# Patient Record
Sex: Female | Born: 1995 | Race: Black or African American | Hispanic: No | Marital: Single | Smoking: Never smoker
Health system: Southern US, Community
[De-identification: ages and names within clinical notes are randomized; demographics above are authoritative.]

## PROBLEM LIST (undated history)

## (undated) DIAGNOSIS — J45909 Unspecified asthma, uncomplicated: Secondary | ICD-10-CM

## (undated) DIAGNOSIS — T7840XA Allergy, unspecified, initial encounter: Secondary | ICD-10-CM

## (undated) HISTORY — DX: Allergy, unspecified, initial encounter: T78.40XA

## (undated) HISTORY — DX: Unspecified asthma, uncomplicated: J45.909

---
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
## 2013-07-27 ENCOUNTER — Ambulatory Visit (INDEPENDENT_AMBULATORY_CARE_PROVIDER_SITE_OTHER): Payer: BC Managed Care – PPO | Admitting: Physician Assistant

## 2013-07-27 VITALS — BP 122/70 | HR 87 | Temp 98.3°F | Resp 18 | Ht 63.0 in | Wt 163.0 lb

## 2013-07-27 DIAGNOSIS — R05 Cough: Secondary | ICD-10-CM

## 2013-07-27 DIAGNOSIS — J309 Allergic rhinitis, unspecified: Secondary | ICD-10-CM

## 2013-07-27 DIAGNOSIS — J0110 Acute frontal sinusitis, unspecified: Secondary | ICD-10-CM

## 2013-07-27 DIAGNOSIS — R059 Cough, unspecified: Secondary | ICD-10-CM

## 2013-07-27 MED ORDER — BENZONATATE 100 MG PO CAPS: 100.0000 mg | ORAL_CAPSULE | Freq: Three times a day (TID) | ORAL | Status: DC | PRN

## 2013-07-27 MED ORDER — FLUTICASONE PROPIONATE 50 MCG/ACT NA SUSP: 2.0000 | Freq: Every day | NASAL | Status: AC

## 2013-07-27 MED ORDER — AMOXICILLIN-POT CLAVULANATE 875-125 MG PO TABS: 1.0000 | ORAL_TABLET | Freq: Two times a day (BID) | ORAL | Status: DC

## 2013-07-27 MED ORDER — HYDROCODONE-HOMATROPINE 5-1.5 MG/5ML PO SYRP: 5.0000 mL | ORAL_SOLUTION | Freq: Three times a day (TID) | ORAL | Status: DC | PRN

## 2013-07-27 NOTE — Patient Instructions (Signed)
The antibiotic prescribed today is for your present infection only. It is very important to follow the directions for the medication prescribed. Antibiotics are generally given for a specified period of time (7-10 days, for example) to be taken at specific intervals (every 4, 6, 8 or 12 hours). This is necessary to keep the right amount of the medication in the bloodstream. Too much of the medication may cause an adverse reaction, too little may not be completely effective.  To clear your infection completely, continue taking the antibiotic for the full time of treatment, even if you begin to feel better after a few days.  If you miss a dose of the antibiotic, take it as soon as possible. Then go back to your regular dosing schedule. However, don't double up doses.    Begin taking the Augmentin as directed.  Be sure to finish the full course. Take with food and water  Continue taking Zyrtec daily  Use the Flonase (2 sprays each nostril) every day - this will work best with consistent daily use  Take benzonatate (Tessalon Perles) every 8 hours as needed for cough  Use Hycodan syrup if needed for cough at bed time - may make you sleepy  Plenty of fluids (water is best!) and rest  Please let us know if any symptoms are worsening or not improving   Sinusitis Sinusitis is redness, soreness, and swelling (inflammation) of the paranasal sinuses. Paranasal sinuses are air pockets within the bones of your face (beneath the eyes, the middle of the forehead, or above the eyes). In healthy paranasal sinuses, mucus is able to drain out, and air is able to circulate through them by way of your nose. However, when your paranasal sinuses are inflamed, mucus and air can become trapped. This can allow bacteria and other germs to grow and cause infection. Sinusitis can develop quickly and last only a short time (acute) or continue over a long period (chronic). Sinusitis that lasts for more than 12 weeks is  considered chronic.  CAUSES  Causes of sinusitis include:  Allergies.  Structural abnormalities, such as displacement of the cartilage that separates your nostrils (deviated septum), which can decrease the air flow through your nose and sinuses and affect sinus drainage.  Functional abnormalities, such as when the small hairs (cilia) that line your sinuses and help remove mucus do not work properly or are not present. SYMPTOMS  Symptoms of acute and chronic sinusitis are the same. The primary symptoms are pain and pressure around the affected sinuses. Other symptoms include:  Upper toothache.  Earache.  Headache.  Bad breath.  Decreased sense of smell and taste.  A cough, which worsens when you are lying flat.  Fatigue.  Fever.  Thick drainage from your nose, which often is green and may contain pus (purulent).  Swelling and warmth over the affected sinuses. DIAGNOSIS  Your caregiver will perform a physical exam. During the exam, your caregiver may:  Look in your nose for signs of abnormal growths in your nostrils (nasal polyps).  Tap over the affected sinus to check for signs of infection.  View the inside of your sinuses (endoscopy) with a special imaging device with a light attached (endoscope), which is inserted into your sinuses. If your caregiver suspects that you have chronic sinusitis, one or more of the following tests may be recommended:  Allergy tests.  Nasal culture A sample of mucus is taken from your nose and sent to a lab and screened for bacteria.  Nasal cytology A sample of mucus is taken from your nose and examined by your caregiver to determine if your sinusitis is related to an allergy. TREATMENT  Most cases of acute sinusitis are related to a viral infection and will resolve on their own within 10 days. Sometimes medicines are prescribed to help relieve symptoms (pain medicine, decongestants, nasal steroid sprays, or saline sprays).  However, for  sinusitis related to a bacterial infection, your caregiver will prescribe antibiotic medicines. These are medicines that will help kill the bacteria causing the infection.  Rarely, sinusitis is caused by a fungal infection. In theses cases, your caregiver will prescribe antifungal medicine. For some cases of chronic sinusitis, surgery is needed. Generally, these are cases in which sinusitis recurs more than 3 times per year, despite other treatments. HOME CARE INSTRUCTIONS   Drink plenty of water. Water helps thin the mucus so your sinuses can drain more easily.  Use a humidifier.  Inhale steam 3 to 4 times a day (for example, sit in the bathroom with the shower running).  Apply a warm, moist washcloth to your face 3 to 4 times a day, or as directed by your caregiver.  Use saline nasal sprays to help moisten and clean your sinuses.  Take over-the-counter or prescription medicines for pain, discomfort, or fever only as directed by your caregiver. SEEK IMMEDIATE MEDICAL CARE IF:  You have increasing pain or severe headaches.  You have nausea, vomiting, or drowsiness.  You have swelling around your face.  You have vision problems.  You have a stiff neck.  You have difficulty breathing. MAKE SURE YOU:   Understand these instructions.  Will watch your condition.  Will get help right away if you are not doing well or get worse. Document Released: 02/22/2005 Document Revised: 05/17/2011 Document Reviewed: 03/09/2011 [facility] Patient Information 2014 [place], [place].

## 2013-07-27 NOTE — Progress Notes (Signed)
   Subjective:    Patient ID: Unice Bailey, female    DOB: 1995/07/09, 18 y.o.   MRN: [id number]  HPI   Ms. Wiberg is a very pleasant 18 yr old female here with concern for illness.  Reports sore throat x 2 weeks - constant.  Now with "really bad cough" this week.  "I can feel it in my chest."  Cough is productive.  No SOB but occ wheeze when coughing.  Pt reports she "outgrew" asthma.  She does have allergies - takes 2 zyrtec daily, uses Flonase prn but admits the flonase is expired.  She does have some headache/pressure.  No fever.  Has taken Aleve D, Dayquil, Nyquil, cough syrup.  Cough is very bothersome at night - keeping everyone awake.  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  HENT: Positive for congestion, rhinorrhea, sinus pressure and sore throat. Negative for ear pain.   Respiratory: Positive for cough and wheezing. Negative for shortness of breath.   Cardiovascular: Negative.   Gastrointestinal: Negative.   Musculoskeletal: Negative.   Skin: Negative.        Objective:   Physical Exam  Vitals reviewed. Constitutional: She is oriented to person, place, and time. She appears well-developed and well-nourished. No distress.  HENT:  Head: Normocephalic and atraumatic.  Right Ear: Tympanic membrane and ear canal normal.  Left Ear: Tympanic membrane and ear canal normal.  Nose: Mucosal edema and rhinorrhea present. Right sinus exhibits frontal sinus tenderness. Right sinus exhibits no maxillary sinus tenderness. Left sinus exhibits no maxillary sinus tenderness and no frontal sinus tenderness.  Mouth/Throat: Uvula is midline, oropharynx is clear and moist and mucous membranes are normal.  Eyes: Conjunctivae are normal. No scleral icterus.  Neck: Neck supple.  Cardiovascular: Normal rate, regular rhythm and normal heart sounds.   Pulmonary/Chest: Effort normal and breath sounds normal. She has no  wheezes. She has no rales.  Lymphadenopathy:    She has no cervical adenopathy.  Neurological: She is alert and oriented to person, place, and time.  Skin: Skin is warm and dry.  Psychiatric: She has a normal mood and affect. Her behavior is normal.       Assessment & Plan:  Acute frontal sinusitis - Plan: fluticasone (FLONASE) 50 MCG/ACT nasal spray, amoxicillin-clavulanate (AUGMENTIN) 875-125 MG per tablet  Cough - Plan: HYDROcodone-homatropine (HYCODAN) 5-1.5 MG/5ML syrup, benzonatate (TESSALON) 100 MG capsule  Allergic rhinitis - Plan: fluticasone (FLONASE) 50 MCG/ACT nasal spray   Ms Hovell is a very pleasant 18 yr old female with sinusitis and allergic rhinitis.  With duration of symptoms and focal tenderness over the RIGHT frontal sinus, will treat with augmentin x 10 days.  I do think that uncontrolled AR is likely contributing to the development of sinusitis.  Discussed with pt who agrees to use Flonase consistently - I have refilled this for her.  Continue Zyrtec.  Tessalon and Hycodan for cough.  Push fluids, rest  Pt to call or RTC if worsening or not improving  E. Frances Furbish MHS, PA-C [facility] 5/23/20159:18 AM

## 2013-09-30 ENCOUNTER — Ambulatory Visit (INDEPENDENT_AMBULATORY_CARE_PROVIDER_SITE_OTHER): Payer: BC Managed Care – PPO | Admitting: Family Medicine

## 2013-09-30 VITALS — BP 100/60 | HR 75 | Temp 98.7°F | Resp 20 | Ht 63.0 in | Wt 163.6 lb

## 2013-09-30 DIAGNOSIS — R059 Cough, unspecified: Secondary | ICD-10-CM

## 2013-09-30 DIAGNOSIS — J209 Acute bronchitis, unspecified: Secondary | ICD-10-CM

## 2013-09-30 DIAGNOSIS — R05 Cough: Secondary | ICD-10-CM

## 2013-09-30 MED ORDER — HYDROCODONE-HOMATROPINE 5-1.5 MG/5ML PO SYRP: 5.0000 mL | ORAL_SOLUTION | Freq: Three times a day (TID) | ORAL | Status: AC | PRN

## 2013-09-30 MED ORDER — AZITHROMYCIN 250 MG PO TABS: ORAL_TABLET | ORAL | Status: DC

## 2013-09-30 NOTE — Progress Notes (Signed)
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 No fever but patient does feel hot.  No history of reactive airways disease, nonsmoker, no hemoptysis  Objective: HEENT unremarkable No acute distress Chest: Rhonchi bilaterally which partially clear with cough Heart: Regular no murmurs Skin: Warm and dry without suspicious lesions or rash  Acute bronchitis, unspecified organism - Plan: azithromycin (ZITHROMAX Z-PAK) 250 MG tablet  Cough - Plan: HYDROcodone-homatropine (HYCODAN) 5-1.5 MG/5ML syrup  Signed, Elvina SidleKurt Ida Uppal, MD

## 2013-09-30 NOTE — Patient Instructions (Signed)

## 2014-09-19 ENCOUNTER — Ambulatory Visit (INDEPENDENT_AMBULATORY_CARE_PROVIDER_SITE_OTHER): Payer: Worker's Compensation | Admitting: Physician Assistant

## 2014-09-19 ENCOUNTER — Ambulatory Visit: Payer: Worker's Compensation

## 2014-09-19 VITALS — BP 120/78 | HR 102 | Temp 98.2°F | Resp 18

## 2014-09-19 DIAGNOSIS — N912 Amenorrhea, unspecified: Secondary | ICD-10-CM | POA: Diagnosis not present

## 2014-09-19 DIAGNOSIS — M79672 Pain in left foot: Secondary | ICD-10-CM | POA: Diagnosis not present

## 2014-09-19 DIAGNOSIS — S99922A Unspecified injury of left foot, initial encounter: Secondary | ICD-10-CM

## 2014-09-19 LAB — POCT URINE PREGNANCY: PREG TEST UR: NEGATIVE

## 2014-09-19 MED ORDER — TRAMADOL HCL 50 MG PO TABS: 50.0000 mg | ORAL_TABLET | Freq: Three times a day (TID) | ORAL | Status: AC | PRN

## 2014-09-19 MED ORDER — IBUPROFEN 600 MG PO TABS: 600.0000 mg | ORAL_TABLET | Freq: Three times a day (TID) | ORAL | Status: DC | PRN

## 2014-09-19 NOTE — Progress Notes (Signed)
   Subjective:    Patient ID: Kelly Chaney, female    DOB: 1995/08/19, 19 y.o.   MRN: [id number]  HPI Worker's comp patient presents for left foot pain following injury when she rolled ankle while on playground with students as she was falling student's jumped on her to continue playing. Ankle rolled inward, but pain is along lateral aspect of foot. Pain does not radiate and is 10/10. Walking increases pain. Denies swelling, redness, numbness, tingling, weakness, or loss of ROM/sensation/fxn. Denies h/o of procedures/surgeries on foot. Has iced foot for 5 minutes and has not tried any medications for pain.    Review of Systems  Constitutional: Negative for fever and activity change.  Musculoskeletal: Positive for myalgias and gait problem. Negative for joint swelling and arthralgias.  Skin: Negative for color change.  Neurological: Negative for weakness and numbness.       Objective:   Physical Exam  Constitutional: She is oriented to person, place, and time. She appears well-developed and well-nourished. No distress.  Blood pressure 120/78, pulse 102, temperature 98.2 F (36.8 C), temperature source Oral, resp. rate 18, last menstrual period 06/07/2014, SpO2 98 %.  HENT:  Head: Normocephalic and atraumatic.  Right Ear: External ear normal.  Left Ear: External ear normal.  Eyes: Conjunctivae are normal. Right eye exhibits no discharge. Left eye exhibits no discharge. No scleral icterus.  Pulmonary/Chest: Effort normal.  Musculoskeletal: Normal range of motion. She exhibits no edema or tenderness.  Pain beneath lateral malleolus extending toweards the metatarsal of left foot. Not tender to palpation. No decreased ROM, swelling, ecchymosis, deformity, or abnormal pulse.   Neurological: She is alert and oriented to person, place, and time. She has normal strength and normal reflexes. No cranial nerve deficit or sensory deficit. She exhibits normal muscle tone. Coordination normal.  Skin:  Skin is warm and dry. No rash noted. She is not diaphoretic. No erythema. No pallor.  Psychiatric: She has a normal mood and affect. Her behavior is normal. Judgment and thought content normal.   [facility] reading (PRIMARY) by  Dr. Conley RollsLe. No acute bony abnormalities.     Assessment & Plan:  1. Foot injury, left, initial encounter 3. Foot pain, left Boot to be worn at patient's discretion. F/u 09/23/14. Ice foot 3-4x 15-20 minutes daily. May return to work. Should elevate foot as able and not be weight bearing for prolonged period of time. - DG Foot Complete Left; Future - ibuprofen (ADVIL,MOTRIN) 600 MG tablet; Take 1 tablet (600 mg total) by mouth every 8 (eight) hours as needed.  Dispense: 30 tablet; Refill: 0 - traMADol (ULTRAM) 50 MG tablet; Take 1 tablet (50 mg total) by mouth every 8 (eight) hours as needed.  Dispense: 30 tablet; Refill: 0  2. Amenorrhea - POCT urine pregnancy   Janan Ridgeishira Shaelin Lalley PA-C  [facility] 09/19/2014 8:27 PM

## 2014-09-23 ENCOUNTER — Ambulatory Visit (INDEPENDENT_AMBULATORY_CARE_PROVIDER_SITE_OTHER): Payer: Worker's Compensation | Admitting: Family Medicine

## 2014-09-23 VITALS — BP 108/64 | HR 86 | Temp 98.7°F | Resp 18 | Ht 64.0 in | Wt 199.0 lb

## 2014-09-23 DIAGNOSIS — S93602D Unspecified sprain of left foot, subsequent encounter: Secondary | ICD-10-CM

## 2014-09-23 MED ORDER — DICLOFENAC SODIUM 75 MG PO TBEC: 75.0000 mg | DELAYED_RELEASE_TABLET | Freq: Every day | ORAL | Status: AC

## 2014-09-23 NOTE — Progress Notes (Signed)
This chart was scribed for Dr. Elvina SidleKurt Glendel Jaggers, MD by Jarvis Morganaylor Ferguson, Medical Scribe. This patient was seen in Room 8 and the patient's care was started at 7:54 PM.   Patient ID: Kelly Chaney MRN: [id number], DOB: May 25, 1995, 19 y.o. Date of Encounter: 09/23/2014, 7:54 PM  Primary Physician: Jefferey PicaUBIN,DAVID M, MD  Chief Complaint:  Chief Complaint  Patient presents with  . Follow-up    WC, left foot     HPI: 19 y.o. year old female with history below presents for a follow up of her workers comp foot pain. Pt was seen in the clinic on 09/19/14, 4 days ago. Pt originally sustained the injury when she rolled her ankle while on the playground and fell and was jumped on by the students. Pt states the ankle rolled inward and the pain is located along the lateral aspect of foot. She notes she is still having intermittent, mild swelling and bruising in the area. Pt is currently wearing a boot. She states ambulating increases pain and swelling. Pt has been taking Tramadol and Ibuprofen for pain relief. She states she has not been able to take Tramadol during the day because it makes her drowsy. She denies h/o previous injuries to the foot. She denies any tingling, numbness, weakness or sensation loss.   Past Medical History  Diagnosis Date  . Allergy   . Asthma      Home Meds: Prior to Admission medications   Medication Sig Start Date End Date Taking? Authorizing Provider  ibuprofen (ADVIL,MOTRIN) 600 MG tablet Take 1 tablet (600 mg total) by mouth every 8 (eight) hours as needed. 09/19/14  Yes Tishira R Brewington, PA-C  traMADol (ULTRAM) 50 MG tablet Take 1 tablet (50 mg total) by mouth every 8 (eight) hours as needed. 09/19/14  Yes Tishira R Brewington, PA-C  azithromycin (ZITHROMAX Z-PAK) 250 MG tablet Take as directed on pack Patient not taking: Reported on 09/19/2014 09/30/13   Elvina SidleKurt Jonathin Heinicke, MD  cetirizine (ZYRTEC) 10 MG tablet Take 10 mg by mouth daily.    Historical Provider, MD    etonogestrel-ethinyl estradiol (NUVARING) 0.12-0.015 MG/24HR vaginal ring Place 1 each vaginally every 28 (twenty-eight) days. Insert vaginally and leave in place for 3 consecutive weeks, then remove for 1 week.    Historical Provider, MD  fluticasone (FLONASE) 50 MCG/ACT nasal spray Place 2 sprays into both nostrils daily. Patient not taking: Reported on 09/19/2014 07/27/13   Godfrey PickEleanore E Egan, PA-C  HYDROcodone-homatropine [facility](HYCODAN) 5-1.5 MG/5ML syrup Take 5 mLs by mouth every 8 (eight) hours as needed for cough. Patient not taking: Reported on 09/19/2014 09/30/13   Elvina SidleKurt Geraldine Sandberg, MD  meloxicam (MOBIC) 15 MG tablet TAKE 1 TABLET (15 MG TOTAL) BY MOUTH DAILY. Patient not taking: Reported on 09/19/2014 04/21/12   Anders SimmondsAngela M McClung, PA-C  naproxen sodium (ANAPROX) 220 MG tablet Take 220 mg by mouth 2 (two) times daily with a meal.    Historical Provider, MD    Allergies: No Known Allergies  History   Social History  . Marital Status: Single    Spouse Name: N/A  . Number of Children: N/A  . Years of Education: N/A   Occupational History  . Not on file.   Social History Main Topics  . Smoking status: Never Smoker   . Smokeless tobacco: Not on file  . Alcohol Use: No  . Drug Use: No  . Sexual Activity: Yes    Birth Control/ Protection: Pill   Other Topics Concern  . Not on  file   Social History Narrative     Review of Systems: Constitutional: negative for chills, fever, night sweats, weight changes, or fatigue  HEENT: negative for vision changes, hearing loss, congestion, rhinorrhea, ST, epistaxis, or sinus pressure Cardiovascular: negative for chest pain or palpitations Respiratory: negative for hemoptysis, wheezing, shortness of breath, or cough Abdominal: negative for abdominal pain, nausea, vomiting, diarrhea, or constipation Dermatological: positive for bruising. negative for rash Neurologic: negative for numbness, tingling , sensation loss, headache, dizziness, or  syncope Musk: positive for arthralgias and swelling. All other systems reviewed and are otherwise negative with the exception to those above and in the HPI.   Physical Exam: Blood pressure 108/64, pulse 86, temperature 98.7 F (37.1 C), resp. rate 18, height  (1.626 m), weight 199 lb (90.266 kg), last menstrual period 06/07/2014., Body mass index is 34.14 kg/(m^2). General: Well developed, well nourished, in no acute distress. Head: Normocephalic, atraumatic, eyes without discharge, sclera non-icteric, nares are without discharge. Bilateral auditory canals clear, TM's are without perforation, pearly grey and translucent with reflective cone of light bilaterally. Oral cavity moist, posterior pharynx without exudate, erythema, peritonsillar abscess, or post nasal drip.  Neck: Supple. No thyromegaly. Full ROM. No lymphadenopathy. Lungs: Clear bilaterally to auscultation without wheezes, rales, or rhonchi. Breathing is unlabored. Heart: RRR with S1 S2. No murmurs, rubs, or gallops appreciated. Abdomen: Soft, non-tender, non-distended with normoactive bowel sounds. No hepatomegaly. No rebound/guarding. No obvious abdominal masses. Msk:  Strength and tone normal for age. Mild ecchymosis of dorsal lateral aspect of left foot. No significant swelling. Extremities/Skin: Warm and dry. No clubbing or cyanosis. No edema. No rashes or suspicious lesions. Neuro: Alert and oriented X 3. Moves all extremities spontaneously. Gait is normal. CNII-XII grossly in tact. Psych:  Responds to questions appropriately with a normal affect.    ASSESSMENT AND PLAN:  19 y.o. year old female with  This chart was scribed in my presence and reviewed by me personally.    ICD-9-CM ICD-10-CM   1. Foot sprain, left, subsequent encounter V58.89 S93.602D diclofenac (VOLTAREN) 75 MG EC tablet   845.10    follow up 13 days  Signed, Elvina Sidle, MD 09/23/2014 7:54 PM

## 2014-09-23 NOTE — Patient Instructions (Signed)
Just take the Ultram at night. Plan on coming back Sunday, July 31 recheck.  Try to keep you foot up as much as possible. Take the diclofenac after breakfast daily.

## 2014-10-20 ENCOUNTER — Other Ambulatory Visit: Payer: Self-pay | Admitting: Physician Assistant

## 2014-10-20 ENCOUNTER — Other Ambulatory Visit: Payer: Self-pay | Admitting: Family Medicine

## 2014-10-25 ENCOUNTER — Ambulatory Visit (INDEPENDENT_AMBULATORY_CARE_PROVIDER_SITE_OTHER): Payer: BLUE CROSS/BLUE SHIELD | Admitting: Family Medicine

## 2014-10-25 ENCOUNTER — Ambulatory Visit (INDEPENDENT_AMBULATORY_CARE_PROVIDER_SITE_OTHER): Payer: Worker's Compensation | Admitting: Family Medicine

## 2014-10-25 VITALS — BP 122/72 | HR 80 | Temp 98.2°F | Resp 17 | Ht 64.0 in | Wt 192.0 lb

## 2014-10-25 VITALS — BP 122/72 | Temp 98.2°F | Resp 17 | Ht 64.0 in | Wt 192.0 lb

## 2014-10-25 DIAGNOSIS — J0111 Acute recurrent frontal sinusitis: Secondary | ICD-10-CM | POA: Diagnosis not present

## 2014-10-25 DIAGNOSIS — Z8709 Personal history of other diseases of the respiratory system: Secondary | ICD-10-CM

## 2014-10-25 DIAGNOSIS — J208 Acute bronchitis due to other specified organisms: Secondary | ICD-10-CM

## 2014-10-25 DIAGNOSIS — R062 Wheezing: Secondary | ICD-10-CM

## 2014-10-25 DIAGNOSIS — S93402D Sprain of unspecified ligament of left ankle, subsequent encounter: Secondary | ICD-10-CM

## 2014-10-25 MED ORDER — CEFDINIR 300 MG PO CAPS: 300.0000 mg | ORAL_CAPSULE | Freq: Two times a day (BID) | ORAL | Status: AC

## 2014-10-25 MED ORDER — ALBUTEROL SULFATE HFA 108 (90 BASE) MCG/ACT IN AERS: 2.0000 | INHALATION_SPRAY | Freq: Four times a day (QID) | RESPIRATORY_TRACT | Status: DC | PRN

## 2014-10-25 NOTE — Progress Notes (Signed)
[facility] [address] [place] [address] [phone]  Date:  10/25/2014   Name:  Kelly Chaney   DOB:  Mar 24, 1995   MRN:  [id number]  PCP:  Jefferey Pica, MD    Chief Complaint: Cough and URI   History of Present Illness:  Kelly Chaney is a 19 y.o. very pleasant female patient who presents with the following:  Here today for illness.  She is a Consulting civil engineer at [employer]. Today is Friday.  Sunday night she noted a severe cough, and was bringing up a lot of mucus by the next day. She tried some OTC medication which did help some, but then she had a lot of sinus and nasal congestion as well.   She is still coughing up a lot of green mucus, and bringing a lot from her nose as well She has not noted a fever. She was seen at school- at the "nursining station" and was given a decongestant No N/V. She has a bit of a ST from cough,  No earache  She may wheeze a little bit- she does have a history of asthma but has not used an inhaler since middle school. She is consistent with her NV Sx are now more in her sinuses than in her chest, but she is still coughing a lot  Patient Active Problem List   Diagnosis Date Noted  . Post-nasal drip 05/09/2012    Past Medical History  Diagnosis Date  . Allergy   . Asthma     No past surgical history on file.  Social History  Substance Use Topics  . Smoking status: Never Smoker   . Smokeless tobacco: None  . Alcohol Use: No    Family History  Problem Relation Age of Onset  . Diabetes Mother   . Hypertension Mother   . Cancer Maternal Grandmother     No Known Allergies  Medication list has been reviewed and updated.  Current Outpatient Prescriptions on File Prior to Visit  Medication Sig Dispense Refill  . cetirizine (ZYRTEC) 10 MG tablet Take 10 mg by mouth daily.    . diclofenac (VOLTAREN) 75 MG EC tablet Take 1 tablet (75 mg total) by mouth daily after breakfast. 30 tablet 0  . etonogestrel-ethinyl  estradiol (NUVARING) 0.12-0.015 MG/24HR vaginal ring Place 1 each vaginally every 28 (twenty-eight) days. Insert vaginally and leave in place for 3 consecutive weeks, then remove for 1 week.    . fluticasone (FLONASE) 50 MCG/ACT nasal spray Place 2 sprays into both nostrils daily. 16 g 6  . HYDROcodone-homatropine (HYCODAN) 5-1.5 MG/5ML syrup Take 5 mLs by mouth every 8 (eight) hours as needed for cough. 120 mL 0  . traMADol (ULTRAM) 50 MG tablet Take 1 tablet (50 mg total) by mouth every 8 (eight) hours as needed. 30 tablet 0   No current facility-administered medications on file prior to visit.    Review of Systems:  As per HPI- otherwise negative.   Physical Examination: Filed Vitals:   10/25/14 0830  BP: 122/72  Temp: 98.2 F (36.8 C)  Resp: 17   Filed Vitals:   10/25/14 0830  Height:  (1.626 m)  Weight: 192 lb (87.091 kg)   Body mass index is 32.94 kg/(m^2). Ideal Body Weight: Weight in (lb) to have BMI = 25: 145.3  GEN: WDWN, NAD, Non-toxic, A & O x 3, obese, looks well HEENT: Atraumatic, Normocephalic. Neck supple. No masses, No LAD.  Bilateral TM wnl, oropharynx normal.  PEERL,EOMI.   Ears and Nose: No external deformity. CV: RRR, No M/G/R. No JVD. No thrill. No extra heart sounds. PULM: no rhonchi. No retractions. No resp. distress. No accessory muscle use. Audible chest congestion and mild wheezing ABD: S, NT, ND, +BS. No rebound. No HSM. EXTR: No c/c/e NEURO Normal gait.  PSYCH: Normally interactive. Conversant. Not depressed or anxious appearing.  Calm demeanor.  Chest congestion, coughing in room  Assessment and Plan: Acute bronchitis due to other specified organisms - Plan: cefdinir (OMNICEF) 300 MG capsule  Acute recurrent frontal sinusitis - Plan: cefdinir (OMNICEF) 300 MG capsule  Wheezing  History of asthma - Plan: albuterol (PROVENTIL HFA;VENTOLIN HFA) 108 (90 BASE) MCG/ACT inhaler  Treat as above with omnicef and albuterol- she will follow-up  if not better soon  Signed Abbe Amsterdam, MD

## 2014-10-25 NOTE — Progress Notes (Signed)
Jendayi Argyle 20-Feb-1996 19 y.o.   Chief Complaint  Patient presents with  . Follow-up    left foot injury      Date of Injury: 09/19/2014  History of Present Illness:  Presents for evaluation of work-related complaint. She rolled her left ankle while working.  Last seen in clinic on 7/18-  At that time she was still wearing a CAM boot.  She was asked to follow-up in about 2 weeks. She stopped using the CAM boot about 20 days ago, and has been using using a velcro ankle brace. No swelling or bruising.   The ankle is generally ok, but occasionally will have a sharp pain.  She will then take a voltaren and it "goes away immediately." However it has not hurt in the last week or so The ankle does not feel unstable.    ROS    No Known Allergies   Current medications reviewed and updated. Past medical history, family history, social history have been reviewed and updated.   Physical Exam  Filed Vitals:   10/25/14 0828  Height:  (1.626 m)  Weight: 192 lb (87.091 kg)    GEN: WDWN, NAD, Non-toxic, A & O x 3 HEENT: Atraumatic, Normocephalic. Neck supple. No masses, No LAD. Ears and Nose: No external deformity. EXTR: No c/c/e NEURO Normal gait.  PSYCH: Normally interactive. Conversant. Not depressed or anxious appearing.  Calm demeanor.  Left ankle: normal exam, no tenderness, swelling, or bruise.  Normal ROM  Assessment and Plan: Ankle sprain, right, subsequent encounter resolved ankle sprain.  She prefers to close the case now, will let us know if any problems come up

## 2014-10-25 NOTE — Patient Instructions (Signed)
You can now return to full duty- let us know if you have any trouble with your ankle. However I think it will do well

## 2014-10-25 NOTE — Patient Instructions (Signed)
Use the albuterol as needed for wheezing and chest congestion, and the antibiotic as directed Let us know if you do not feel better over the next few days- Sooner if worse.   It is ok to use OTC medications for cough, congestion, and aches/ fever as needed

## 2017-06-21 IMAGING — CR DG FOOT COMPLETE 3+V*L*
3 series · 3 of 3 positions shown · non-contrast
Comparison: None.

CLINICAL DATA: 19-year-old female with left foot pain hand injury

EXAM:
LEFT FOOT - COMPLETE 3+ VIEW

[AP]
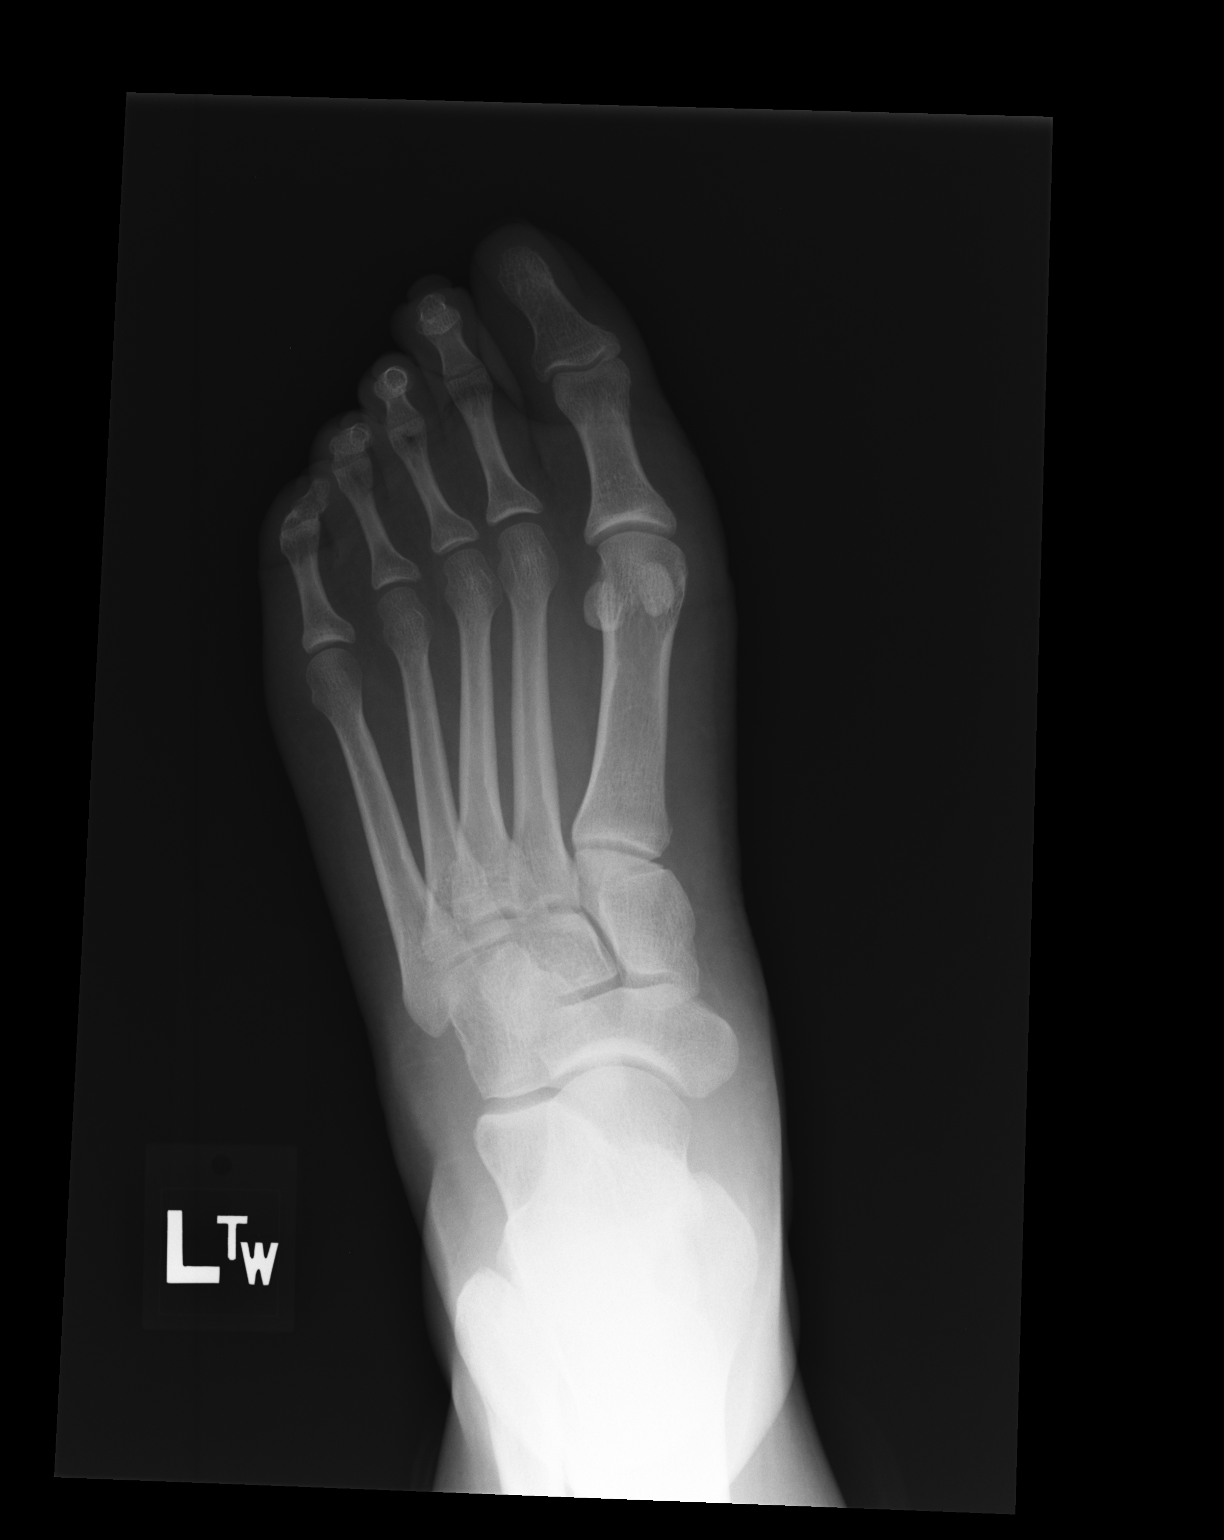

[ap obl int rot]
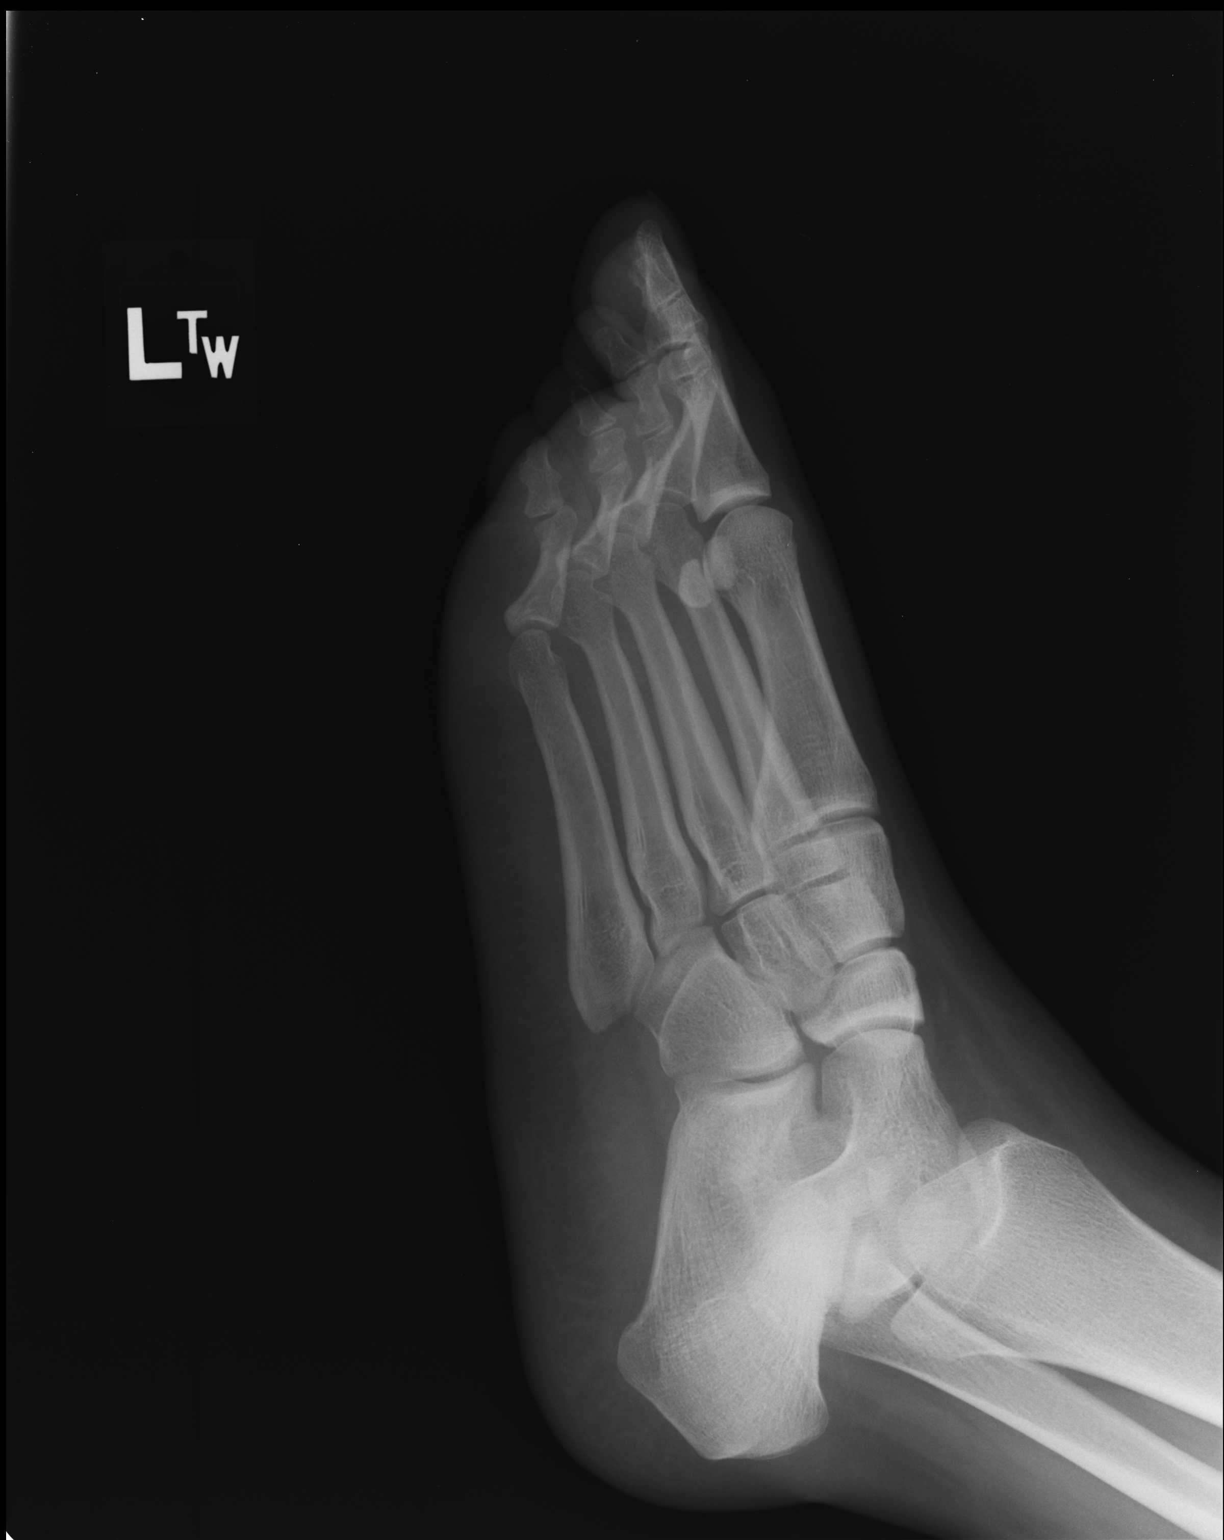

[lateral]
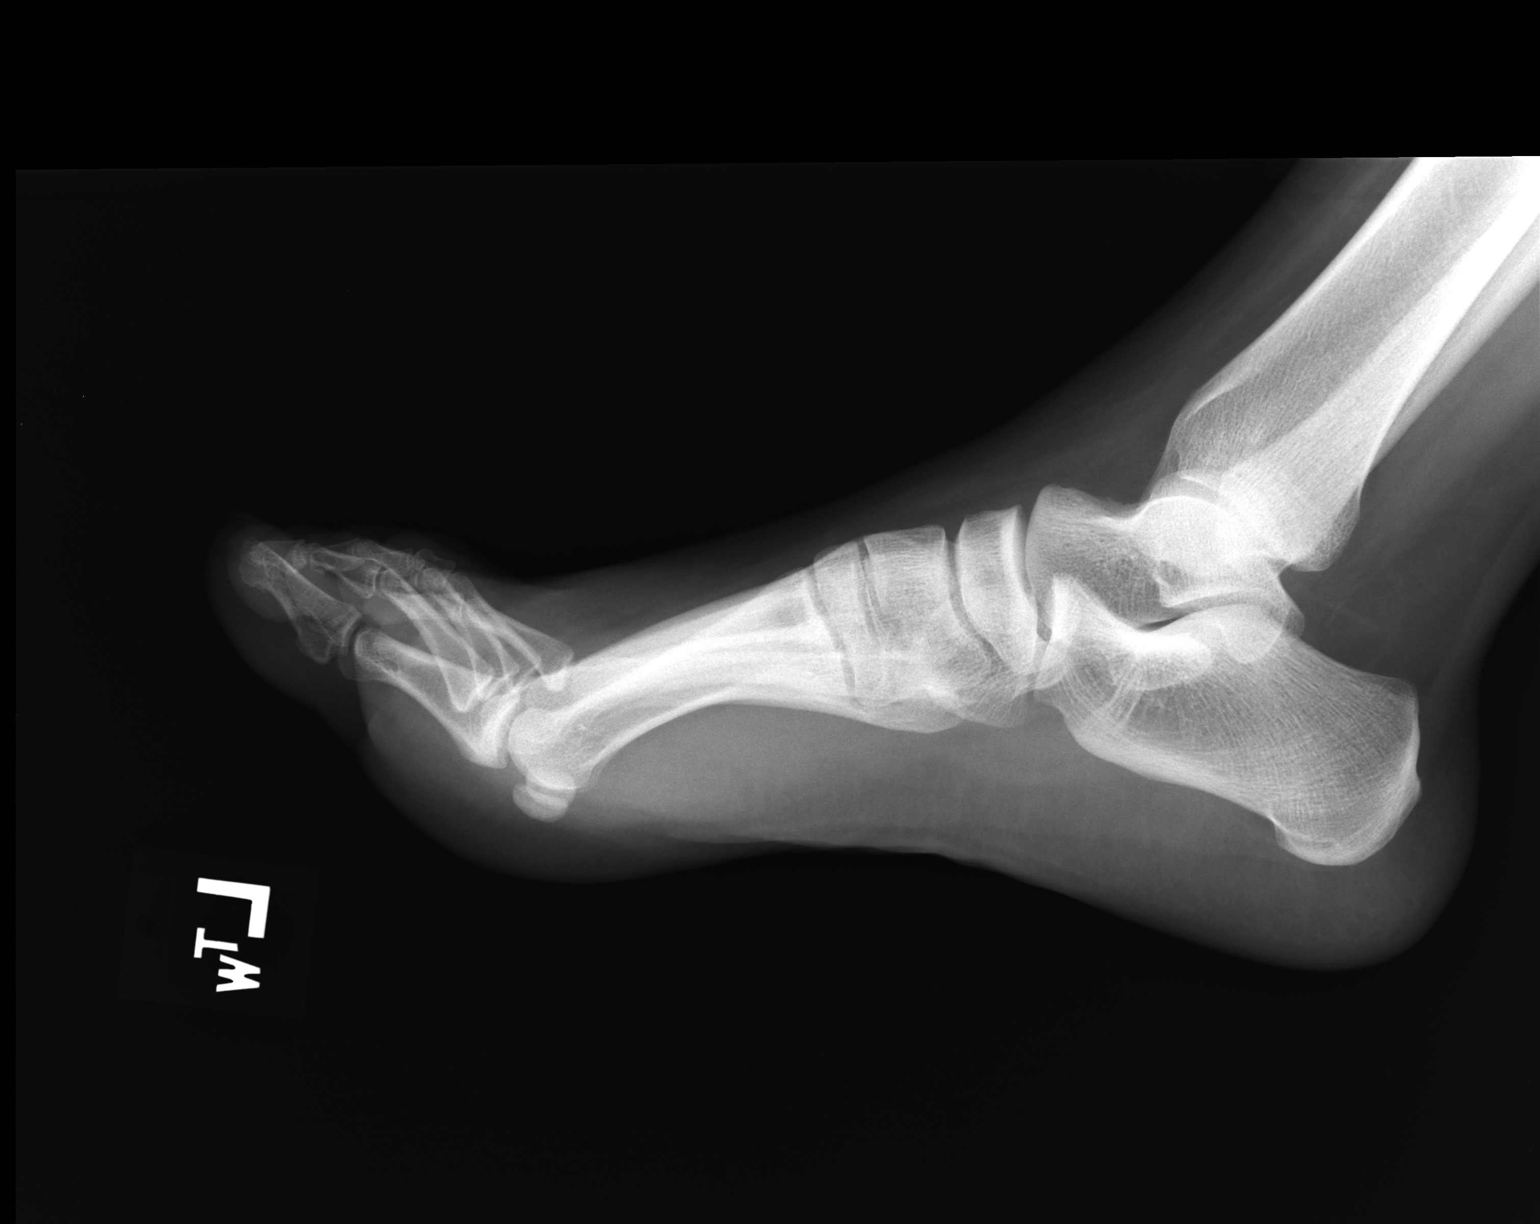

[3 of 3 positions shown; findings below may reference images not displayed]

FINDINGS: There is no evidence of fracture or dislocation. There is no
evidence of arthropathy or other focal bone abnormality. Soft
tissues are unremarkable.
IMPRESSION: Negative.

## 2020-08-01 ENCOUNTER — Emergency Department (HOSPITAL_COMMUNITY)
Admission: EM | Admit: 2020-08-01 | Discharge: 2020-08-01 | Disposition: A | Discharge status: Home / Self Care | Payer: 59 | Attending: Emergency Medicine | Admitting: Emergency Medicine

## 2020-08-01 ENCOUNTER — Other Ambulatory Visit: Payer: Self-pay

## 2020-08-01 DIAGNOSIS — Z8709 Personal history of other diseases of the respiratory system: Secondary | ICD-10-CM

## 2020-08-01 DIAGNOSIS — J45909 Unspecified asthma, uncomplicated: Secondary | ICD-10-CM | POA: Diagnosis not present

## 2020-08-01 DIAGNOSIS — U071 COVID-19: Secondary | ICD-10-CM | POA: Insufficient documentation

## 2020-08-01 DIAGNOSIS — R062 Wheezing: Secondary | ICD-10-CM | POA: Diagnosis present

## 2020-08-01 MED ORDER — IBUPROFEN 200 MG PO TABS
600.0000 mg | ORAL_TABLET | Freq: Once | ORAL | Status: AC
  Administered 2020-08-01: 600 mg via ORAL
  Filled 2020-08-01: qty 3

## 2020-08-01 MED ORDER — ALBUTEROL SULFATE HFA 108 (90 BASE) MCG/ACT IN AERS: 2.0000 | INHALATION_SPRAY | Freq: Four times a day (QID) | RESPIRATORY_TRACT | 0 refills | Status: AC | PRN

## 2020-08-01 NOTE — ED Notes (Signed)
Pt ambulated around the room without difficulty. O2 did not drop below 93% and immediately returned to 95% upon stopping.

## 2020-08-01 NOTE — Discharge Instructions (Signed)
Your COVID test is pending; you should expect results within 24 hours. You can access your results on your MyChart--if you test positive you should receive a phone call.  In the meantime follow CDC guidelines and quarantine, wear a mask, wash hands often.   Please use Tylenol or ibuprofen for pain.  You may use 600 mg ibuprofen every 6 hours or 1000 mg of Tylenol every 6 hours.  You may choose to alternate between the 2.  This would be most effective.  Not to exceed 4 g of Tylenol within 24 hours.  Not to exceed 3200 mg ibuprofen 24 hours.   Please take over the counter vitamin D 2000-4000 units per day. I also recommend zinc 50 mg per day for the next two weeks.   Please return to ED if you feel have difficulty breathing or have emergent, new or concerning symptoms.  Patients who have symptoms consistent with COVID-19 should self isolated for: At least 3 days (72 hours) have passed since recovery, defined as resolution of fever without the use of fever reducing medications and improvement in respiratory symptoms (e.g., cough, shortness of breath), and At least 7 days have passed since symptoms first appeared.       Person Under Monitoring Name: Kelly Chaney  Location: [address] [place] [address]   Infection Prevention Recommendations for Individuals Confirmed to have, or Being Evaluated for, 2019 Novel Coronavirus (COVID-19) Infection Who Receive Care at Home  Individuals who are confirmed to have, or are being evaluated for, COVID-19 should follow the prevention steps below until a healthcare provider or local or state health department says they can return to normal activities.  Stay home except to get medical care You should restrict activities outside your home, except for getting medical care. Do not go to work, school, or public areas, and do not use public transportation or taxis.  Call ahead before visiting your doctor Before your medical  appointment, call the healthcare provider and tell them that you have, or are being evaluated for, COVID-19 infection. This will help the healthcare provider's office take steps to keep other people from getting infected. Ask your healthcare provider to call the local or state health department.  Monitor your symptoms Seek prompt medical attention if your illness is worsening (e.g., difficulty breathing). Before going to your medical appointment, call the healthcare provider and tell them that you have, or are being evaluated for, COVID-19 infection. Ask your healthcare provider to call the local or state health department.  Wear a facemask You should wear a facemask that covers your nose and mouth when you are in the same room with other people and when you visit a healthcare provider. People who live with or visit you should also wear a facemask while they are in the same room with you.  Separate yourself from other people in your home As much as possible, you should stay in a different room from other people in your home. Also, you should use a separate bathroom, if available.  Avoid sharing household items You should not share dishes, drinking glasses, cups, eating utensils, towels, bedding, or other items with other people in your home. After using these items, you should wash them thoroughly with soap and water.  Cover your coughs and sneezes Cover your mouth and nose with a tissue when you cough or sneeze, or you can cough or sneeze into your sleeve. Throw used tissues in a lined trash can, and immediately wash  your hands with soap and water for at least 20 seconds or use an alcohol-based hand rub.  Wash your [employer] your hands often and thoroughly with soap and water for at least 20 seconds. You can use an alcohol-based hand sanitizer if soap and water are not available and if your hands are not visibly dirty. Avoid touching your eyes, nose, and mouth with unwashed  hands.   Prevention Steps for Caregivers and Household Members of Individuals Confirmed to have, or Being Evaluated for, COVID-19 Infection Being Cared for in the Home  If you live with, or provide care at home for, a person confirmed to have, or being evaluated for, COVID-19 infection please follow these guidelines to prevent infection:  Follow healthcare provider's instructions Make sure that you understand and can help the patient follow any healthcare provider instructions for all care.  Provide for the patient's basic needs You should help the patient with basic needs in the home and provide support for getting groceries, prescriptions, and other personal needs.  Monitor the patient's symptoms If they are getting sicker, call his or her medical provider and tell them that the patient has, or is being evaluated for, COVID-19 infection. This will help the healthcare provider's office take steps to keep other people from getting infected. Ask the healthcare provider to call the local or state health department.  Limit the number of people who have contact with the patient If possible, have only one caregiver for the patient. Other household members should stay in another home or place of residence. If this is not possible, they should stay in another room, or be separated from the patient as much as possible. Use a separate bathroom, if available. Restrict visitors who do not have an essential need to be in the home.  Keep older adults, very young children, and other sick people away from the patient Keep older adults, very young children, and those who have compromised immune systems or chronic health conditions away from the patient. This includes people with chronic heart, lung, or kidney conditions, diabetes, and cancer.  Ensure good ventilation Make sure that shared spaces in the home have good air flow, such as from an air conditioner or an opened window, weather  permitting.  Wash your hands often Wash your hands often and thoroughly with soap and water for at least 20 seconds. You can use an alcohol based hand sanitizer if soap and water are not available and if your hands are not visibly dirty. Avoid touching your eyes, nose, and mouth with unwashed hands. Use disposable paper towels to dry your hands. If not available, use dedicated cloth towels and replace them when they become wet.  Wear a facemask and gloves Wear a disposable facemask at all times in the room and gloves when you touch or have contact with the patient's blood, body fluids, and/or secretions or excretions, such as sweat, saliva, sputum, nasal mucus, vomit, urine, or feces.  Ensure the mask fits over your nose and mouth tightly, and do not touch it during use. Throw out disposable facemasks and gloves after using them. Do not reuse. Wash your hands immediately after removing your facemask and gloves. If your personal clothing becomes contaminated, carefully remove clothing and launder. Wash your hands after handling contaminated clothing. Place all used disposable facemasks, gloves, and other waste in a lined container before disposing them with other household waste. Remove gloves and wash your hands immediately after handling these items.  Do not share dishes,  glasses, or other household items with the patient Avoid sharing household items. You should not share dishes, drinking glasses, cups, eating utensils, towels, bedding, or other items with a patient who is confirmed to have, or being evaluated for, COVID-19 infection. After the person uses these items, you should wash them thoroughly with soap and water.  Wash laundry thoroughly Immediately remove and wash clothes or bedding that have blood, body fluids, and/or secretions or excretions, such as sweat, saliva, sputum, nasal mucus, vomit, urine, or feces, on them. Wear gloves when handling laundry from the patient. Read and  follow directions on labels of laundry or clothing items and detergent. In general, wash and dry with the warmest temperatures recommended on the label.  Clean all areas the individual has used often Clean all touchable surfaces, such as counters, tabletops, doorknobs, bathroom fixtures, toilets, phones, keyboards, tablets, and bedside tables, every day. Also, clean any surfaces that may have blood, body fluids, and/or secretions or excretions on them. Wear gloves when cleaning surfaces the patient has come in contact with. Use a diluted bleach solution (e.g., dilute bleach with 1 part bleach and 10 parts water) or a household disinfectant with a label that says EPA-registered for coronaviruses. To make a bleach solution at home, add 1 tablespoon of bleach to 1 quart (4 cups) of water. For a larger supply, add  cup of bleach to 1 gallon (16 cups) of water. Read labels of cleaning products and follow recommendations provided on product labels. Labels contain instructions for safe and effective use of the cleaning product including precautions you should take when applying the product, such as wearing gloves or eye protection and making sure you have good ventilation during use of the product. Remove gloves and wash hands immediately after cleaning.  Monitor yourself for signs and symptoms of illness Caregivers and household members are considered close contacts, should monitor their health, and will be asked to limit movement outside of the home to the extent possible. Follow the monitoring steps for close contacts listed on the symptom monitoring form.   ? If you have additional questions, contact your local health department or call the epidemiologist on call at [phone] (available 24/7). ? This guidance is subject to change. For the most up-to-date guidance from [facility], please refer to their website: [email or web address]

## 2020-08-01 NOTE — ED Triage Notes (Signed)
Pt from home and reports the following. Pt tested positive for COVID 5/23 and quarantining at home. On 5/25 pt reports wheezing and difficultly breathing. Currently, no other symptoms. Hx asthma as a child.

## 2020-08-01 NOTE — ED Provider Notes (Signed)
[facility]-EMERGENCY DEPT Provider Note   CSN: [id number] Arrival date & time: 08/01/20  1049     History Chief Complaint  Patient presents with  . Wheezing    Kelly Chaney is a 25 y.o. female.  HPI Patient is a 25 year old female with past medical history of asthma as a child which she states she has not had to have any inhalers or any other medications for since she was in her adolescence.  Patient is presented today after positive COVID test 5/23 she states she is quarantining at home feels that her symptoms are relatively mild but is complaining of some occasional wheezing.  She states that she has no symptoms presently but states that the wheezing seems to come and go.  She states that when she does have wheezing she feels somewhat short of breath.  Denies any chest pain lightheadedness or dizziness.  She has taken no medications prior to arrival.  Does endorse some body aches occasionally as well.  No fevers at home -- temperature 99.8 here.    Past Medical History:  Diagnosis Date  . Allergy   . Asthma     Patient Active Problem List   Diagnosis Date Noted  . Post-nasal drip 05/09/2012    No past surgical history on file.   OB History   No obstetric history on file.     Family History  Problem Relation Age of Onset  . Diabetes Mother   . Hypertension Mother   . Cancer Maternal Grandmother     Social History   Tobacco Use  . Smoking status: Never Smoker  Substance Use Topics  . Alcohol use: No  . Drug use: No    Home Medications Prior to Admission medications   Medication Sig Start Date End Date Taking? Authorizing Provider  albuterol (VENTOLIN HFA) 108 (90 Base) MCG/ACT inhaler Inhale 2 puffs into the lungs every 6 (six) hours as needed for wheezing or shortness of breath. 08/01/20   Ardella Chhim, Rodrigo Ran, PA  cefdinir (OMNICEF) 300 MG capsule Take 1 capsule (300 mg total) by mouth 2 (two) times daily. 10/25/14   Copland, Gwenlyn Found,  MD  cetirizine (ZYRTEC) 10 MG tablet Take 10 mg by mouth daily.    [provider]  diclofenac (VOLTAREN) 75 MG EC tablet Take 1 tablet (75 mg total) by mouth daily after breakfast. 09/23/14   Elvina Sidle, MD  etonogestrel-ethinyl estradiol (NUVARING) 0.12-0.015 MG/24HR vaginal ring Place 1 each vaginally every 28 (twenty-eight) days. Insert vaginally and leave in place for 3 consecutive weeks, then remove for 1 week.    [provider]  fluticasone (FLONASE) 50 MCG/ACT nasal spray Place 2 sprays into both nostrils daily. 07/27/13   Godfrey Pick, PA-C  HYDROcodone-homatropine (HYCODAN) 5-1.5 MG/5ML syrup Take 5 mLs by mouth every 8 (eight) hours as needed for cough. 09/30/13   Elvina Sidle, MD  traMADol (ULTRAM) 50 MG tablet Take 1 tablet (50 mg total) by mouth every 8 (eight) hours as needed. 09/19/14   Brewington, Tishira R, PA-C    Allergies    Patient has no known allergies.  Review of Systems   Review of Systems  Constitutional: Negative for chills and fever.  HENT: Negative for congestion.   Eyes: Negative for pain.  Respiratory: Positive for shortness of breath (Not present currently) and wheezing (Not currently). Negative for cough.   Cardiovascular: Negative for chest pain and leg swelling.  Gastrointestinal: Negative for abdominal pain and vomiting.  Genitourinary: Negative  for dysuria.  Musculoskeletal: Negative for myalgias.  Skin: Negative for rash.  Neurological: Negative for dizziness and headaches.    Physical Exam Updated Vital Signs BP (!) 141/88   Pulse 83   Temp 99.8 F (37.7 C) (Oral)   Resp 17   Ht 5\' 4"  (1.626 m)   Wt 96.2 kg   SpO2 95%   BMI 36.39 kg/m   Physical Exam Vitals and nursing note reviewed.  Constitutional:      General: She is not in acute distress.    Comments: Pleasant well-appearing 25 year old.  In no acute distress.  Sitting comfortably in bed.  Able answer questions appropriately follow commands. No  increased work of breathing. Speaking in full sentences.   HENT:     Head: Normocephalic and atraumatic.     Nose: Nose normal.     Mouth/Throat:     Mouth: Mucous membranes are moist.  Eyes:     General: No scleral icterus. Cardiovascular:     Rate and Rhythm: Normal rate and regular rhythm.     Pulses: Normal pulses.     Heart sounds: Normal heart sounds.  Pulmonary:     Effort: Pulmonary effort is normal. No respiratory distress.     Breath sounds: No wheezing.  Abdominal:     Palpations: Abdomen is soft.     Tenderness: There is no abdominal tenderness. There is no guarding or rebound.  Musculoskeletal:     Cervical back: Normal range of motion.     Right lower leg: No edema.     Left lower leg: No edema.     Comments: No lower extremity edema or calf tenderness  Skin:    General: Skin is warm and dry.     Capillary Refill: Capillary refill takes less than 2 seconds.  Neurological:     Mental Status: She is alert. Mental status is at baseline.  Psychiatric:        Mood and Affect: Mood normal.        Behavior: Behavior normal.     ED Results / Procedures / Treatments   Labs (all labs ordered are listed, but only abnormal results are displayed) Labs Reviewed - No data to display  EKG None  Radiology No results found.  Procedures Procedures   Medications Ordered in ED Medications  ibuprofen (ADVIL) tablet 600 mg (600 mg Oral Given 08/01/20 1242)    ED Course  I have reviewed the triage vital signs and the nursing notes.  Pertinent labs & imaging results that were available during my care of the patient were reviewed by me and considered in my medical decision making (see chart for details).    MDM Rules/Calculators/A&P                          Patient here because she would like albuterol inhaler for intermittent wheezing.  No symptoms present apart from body aches currently.  Will prescribe albuterol inhaler and provide with 1 dose of ibuprofen here.   Vital signs within normal limits.  No chest pain or shortness of breath to indicate any evidence of PE I have low suspicion for this doubt secondary pneumonia she is afebrile and well-appearing.  She is also not having significant coughing.  No hemoptysis or lightheadedness or dizziness.  Patient given return precautions.  SPO2 within normal limits on room air.  Ambulated with pulse ox no desaturation.  Lungs are clear to auscultation all fields.  Kelly  Chaney was evaluated in Emergency Department on 08/01/2020 for the symptoms described in the history of present illness. She was evaluated in the context of the global COVID-19 pandemic, which necessitated consideration that the patient might be at risk for infection with the SARS-CoV-2 virus that causes COVID-19. Institutional protocols and algorithms that pertain to the evaluation of patients at risk for COVID-19 are in a state of rapid change based on information released by regulatory bodies including the CDC and federal and state organizations. These policies and algorithms were followed during the patient's care in the ED.  Final Clinical Impression(s) / ED Diagnoses Final diagnoses:  COVID-19    Rx / DC Orders ED Discharge Orders         Ordered    albuterol (VENTOLIN HFA) 108 (90 Base) MCG/ACT inhaler  Every 6 hours PRN        08/01/20 1233           Gailen Shelter, [place] 08/01/20 1658    Cheryll Cockayne, MD 08/02/20 2003
